# Patient Record
Sex: Male | Born: 1985 | Hispanic: Yes | Marital: Single | Smoking: Never smoker
Health system: Southern US, Community
[De-identification: ages and names within clinical notes are randomized; demographics above are authoritative.]

## PROBLEM LIST (undated history)

## (undated) DIAGNOSIS — Z9109 Other allergy status, other than to drugs and biological substances: Secondary | ICD-10-CM

---

## 2009-12-25 HISTORY — PX: FRACTURE SURGERY: SHX138

## 2012-05-28 ENCOUNTER — Other Ambulatory Visit (HOSPITAL_COMMUNITY): Payer: Self-pay | Admitting: Orthopedic Surgery

## 2012-06-10 ENCOUNTER — Encounter (HOSPITAL_COMMUNITY): Payer: Self-pay | Admitting: Pharmacy Technician

## 2012-06-11 ENCOUNTER — Encounter (HOSPITAL_COMMUNITY)
Admission: RE | Admit: 2012-06-11 | Discharge: 2012-06-11 | Disposition: A | Discharge status: Home / Self Care | Payer: Worker's Compensation | Source: Ambulatory Visit | Attending: Orthopedic Surgery | Admitting: Orthopedic Surgery

## 2012-06-11 ENCOUNTER — Encounter (HOSPITAL_COMMUNITY): Payer: Self-pay

## 2012-06-11 HISTORY — DX: Other allergy status, other than to drugs and biological substances: Z91.09

## 2012-06-11 LAB — TYPE AND SCREEN: ABO/RH(D): A POS

## 2012-06-11 LAB — CBC
Hemoglobin: 16 g/dL (ref 13.0–17.0)
MCV: 82.8 fL (ref 78.0–100.0)
Platelets: 213 10*3/uL (ref 150–400)
RBC: 5.24 MIL/uL (ref 4.22–5.81)
WBC: 6.3 10*3/uL (ref 4.0–10.5)

## 2012-06-11 LAB — SURGICAL PCR SCREEN: Staphylococcus aureus: POSITIVE — AB

## 2012-06-11 LAB — BASIC METABOLIC PANEL
CO2: 27 mEq/L (ref 19–32)
Calcium: 9.3 mg/dL (ref 8.4–10.5)
Potassium: 3.7 mEq/L (ref 3.5–5.1)
Sodium: 139 mEq/L (ref 135–145)

## 2012-06-11 LAB — ABO/RH: ABO/RH(D): A POS

## 2012-06-11 MED ORDER — CHLORHEXIDINE GLUCONATE 4 % EX LIQD: 60.0000 mL | Freq: Once | CUTANEOUS | Status: DC

## 2012-06-11 NOTE — Progress Notes (Signed)
Interpreter  Arthor Captain with patient

## 2012-06-11 NOTE — Pre-Procedure Instructions (Signed)
20 Shahzaib Azevedo  06/11/2012   Your procedure is scheduled on:  *06/18/12*  Report to [facility] at 530 AM.  Call this number if you have problems the morning of surgery: [phone]   Remember:   Do not eat food:After Midnight.    Take these medicines the morning of surgery with A SIP OF WATER: tylenol, afrin   Do not wear jewelry, make-up or nail polish.  Do not wear lotions, powders, or perfumes. You may wear deodorant.  Do not shave 48 hours prior to surgery. Men may shave face and neck.  Do not bring valuables to the hospital.  Contacts, dentures or bridgework may not be worn into surgery.  Leave suitcase in the car. After surgery it may be brought to your room.  For patients admitted to the hospital, checkout time is 11:00 AM the day of discharge.   Patients discharged the day of surgery will not be allowed to drive home.  Name and phone number of your driver: Byrd Hesselbach wife [phone]  Special Instructions: CHG Shower Use Special Wash: 1/2 bottle night before surgery and 1/2 bottle morning of surgery.   Please read over the following fact sheets that you were given: Pain Booklet, Coughing and Deep Breathing, Blood Transfusion Information and MRSA Information

## 2012-06-17 MED ORDER — CEFAZOLIN SODIUM-DEXTROSE 2-3 GM-% IV SOLR
2.0000 g | INTRAVENOUS | Status: AC
  Administered 2012-06-18: 2 g via INTRAVENOUS
  Filled 2012-06-17: qty 50

## 2012-06-18 ENCOUNTER — Encounter (HOSPITAL_COMMUNITY)
Admission: RE | Disposition: A | Discharge status: Home / Self Care | Payer: Self-pay | Source: Ambulatory Visit | Attending: Orthopedic Surgery

## 2012-06-18 ENCOUNTER — Encounter (HOSPITAL_COMMUNITY): Payer: Self-pay | Admitting: *Deleted

## 2012-06-18 ENCOUNTER — Inpatient Hospital Stay (HOSPITAL_COMMUNITY): Payer: Worker's Compensation

## 2012-06-18 ENCOUNTER — Encounter (HOSPITAL_COMMUNITY): Payer: Self-pay | Admitting: Anesthesiology

## 2012-06-18 ENCOUNTER — Inpatient Hospital Stay (HOSPITAL_COMMUNITY)
Admission: RE | Admit: 2012-06-18 | Discharge: 2012-06-19 | DRG: 482 | Disposition: A | Discharge status: Home / Self Care | Payer: Worker's Compensation | Source: Ambulatory Visit | Attending: Orthopedic Surgery | Admitting: Orthopedic Surgery

## 2012-06-18 ENCOUNTER — Ambulatory Visit (HOSPITAL_COMMUNITY): Payer: Worker's Compensation | Admitting: Anesthesiology

## 2012-06-18 DIAGNOSIS — IMO0002 Reserved for concepts with insufficient information to code with codable children: Principal | ICD-10-CM | POA: Diagnosis present

## 2012-06-18 DIAGNOSIS — S7290XA Unspecified fracture of unspecified femur, initial encounter for closed fracture: Secondary | ICD-10-CM

## 2012-06-18 DIAGNOSIS — Z79899 Other long term (current) drug therapy: Secondary | ICD-10-CM

## 2012-06-18 HISTORY — PX: FEMUR IM NAIL: SHX1597

## 2012-06-18 SURGERY — INSERTION, INTRAMEDULLARY ROD, FEMUR
Anesthesia: General | Site: Leg Upper | Laterality: Left | Wound class: Clean

## 2012-06-18 MED ORDER — DIPHENHYDRAMINE HCL 50 MG/ML IJ SOLN: 12.5000 mg | Freq: Four times a day (QID) | INTRAMUSCULAR | Status: DC | PRN

## 2012-06-18 MED ORDER — DEXAMETHASONE SODIUM PHOSPHATE 4 MG/ML IJ SOLN
INTRAMUSCULAR | Status: DC | PRN
  Administered 2012-06-18: 8 mg via INTRAVENOUS

## 2012-06-18 MED ORDER — FENTANYL CITRATE 0.05 MG/ML IJ SOLN
INTRAMUSCULAR | Status: DC | PRN
  Administered 2012-06-18: 50 ug via INTRAVENOUS
  Administered 2012-06-18: 100 ug via INTRAVENOUS
  Administered 2012-06-18: 250 ug via INTRAVENOUS
  Administered 2012-06-18: 100 ug via INTRAVENOUS

## 2012-06-18 MED ORDER — ROCURONIUM BROMIDE 100 MG/10ML IV SOLN
INTRAVENOUS | Status: DC | PRN
  Administered 2012-06-18: 50 mg via INTRAVENOUS

## 2012-06-18 MED ORDER — LIDOCAINE HCL (CARDIAC) 20 MG/ML IV SOLN
INTRAVENOUS | Status: DC | PRN
  Administered 2012-06-18: 40 mg via INTRAVENOUS

## 2012-06-18 MED ORDER — MORPHINE SULFATE 2 MG/ML IJ SOLN: 1.0000 mg | INTRAMUSCULAR | Status: DC | PRN

## 2012-06-18 MED ORDER — MEPERIDINE HCL 25 MG/ML IJ SOLN: 6.2500 mg | INTRAMUSCULAR | Status: DC | PRN

## 2012-06-18 MED ORDER — METHOCARBAMOL 500 MG PO TABS
500.0000 mg | ORAL_TABLET | Freq: Four times a day (QID) | ORAL | Status: DC | PRN
  Administered 2012-06-18: 500 mg via ORAL
  Filled 2012-06-18: qty 1

## 2012-06-18 MED ORDER — METHOCARBAMOL 100 MG/ML IJ SOLN
500.0000 mg | Freq: Four times a day (QID) | INTRAVENOUS | Status: DC | PRN
  Filled 2012-06-18: qty 5

## 2012-06-18 MED ORDER — PROPOFOL 10 MG/ML IV EMUL
INTRAVENOUS | Status: DC | PRN
  Administered 2012-06-18: 400 mg via INTRAVENOUS

## 2012-06-18 MED ORDER — MIDAZOLAM HCL 2 MG/2ML IJ SOLN: 0.5000 mg | Freq: Once | INTRAMUSCULAR | Status: DC | PRN

## 2012-06-18 MED ORDER — NALOXONE HCL 0.4 MG/ML IJ SOLN: 0.4000 mg | INTRAMUSCULAR | Status: DC | PRN

## 2012-06-18 MED ORDER — MIDAZOLAM HCL 5 MG/5ML IJ SOLN
INTRAMUSCULAR | Status: DC | PRN
  Administered 2012-06-18: 2 mg via INTRAVENOUS

## 2012-06-18 MED ORDER — HYDROMORPHONE HCL PF 1 MG/ML IJ SOLN
INTRAMUSCULAR | Status: AC
  Filled 2012-06-18: qty 1

## 2012-06-18 MED ORDER — MORPHINE SULFATE (PF) 1 MG/ML IV SOLN
INTRAVENOUS | Status: AC
  Filled 2012-06-18: qty 25

## 2012-06-18 MED ORDER — LACTATED RINGERS IV SOLN
INTRAVENOUS | Status: DC | PRN
  Administered 2012-06-18 (×4): via INTRAVENOUS

## 2012-06-18 MED ORDER — SODIUM CHLORIDE 0.9 % IJ SOLN: 9.0000 mL | INTRAMUSCULAR | Status: DC | PRN

## 2012-06-18 MED ORDER — ONDANSETRON HCL 4 MG/2ML IJ SOLN: 4.0000 mg | Freq: Four times a day (QID) | INTRAMUSCULAR | Status: DC | PRN

## 2012-06-18 MED ORDER — PROMETHAZINE HCL 25 MG/ML IJ SOLN: 6.2500 mg | INTRAMUSCULAR | Status: DC | PRN

## 2012-06-18 MED ORDER — OXYCODONE-ACETAMINOPHEN 5-325 MG PO TABS
1.0000 | ORAL_TABLET | ORAL | Status: DC | PRN
  Administered 2012-06-19 (×2): 2 via ORAL
  Filled 2012-06-18 (×2): qty 2

## 2012-06-18 MED ORDER — METOCLOPRAMIDE HCL 5 MG/ML IJ SOLN: 5.0000 mg | Freq: Three times a day (TID) | INTRAMUSCULAR | Status: DC | PRN

## 2012-06-18 MED ORDER — HYDROMORPHONE HCL PF 1 MG/ML IJ SOLN
0.2500 mg | INTRAMUSCULAR | Status: DC | PRN
  Administered 2012-06-18 (×2): 0.5 mg via INTRAVENOUS

## 2012-06-18 MED ORDER — DIPHENHYDRAMINE HCL 12.5 MG/5ML PO ELIX: 12.5000 mg | ORAL_SOLUTION | Freq: Four times a day (QID) | ORAL | Status: DC | PRN

## 2012-06-18 MED ORDER — ONDANSETRON HCL 4 MG PO TABS: 4.0000 mg | ORAL_TABLET | Freq: Four times a day (QID) | ORAL | Status: DC | PRN

## 2012-06-18 MED ORDER — 0.9 % SODIUM CHLORIDE (POUR BTL) OPTIME
TOPICAL | Status: DC | PRN
  Administered 2012-06-18: 1000 mL

## 2012-06-18 MED ORDER — METOCLOPRAMIDE HCL 10 MG PO TABS: 5.0000 mg | ORAL_TABLET | Freq: Three times a day (TID) | ORAL | Status: DC | PRN

## 2012-06-18 MED ORDER — POTASSIUM CHLORIDE IN NACL 20-0.9 MEQ/L-% IV SOLN
INTRAVENOUS | Status: AC
  Administered 2012-06-18: 1000 mL via INTRAVENOUS
  Filled 2012-06-18 (×2): qty 1000

## 2012-06-18 MED ORDER — CEFAZOLIN SODIUM 1-5 GM-% IV SOLN
1.0000 g | Freq: Three times a day (TID) | INTRAVENOUS | Status: AC
  Administered 2012-06-18 – 2012-06-19 (×3): 1 g via INTRAVENOUS
  Filled 2012-06-18 (×3): qty 50

## 2012-06-18 MED ORDER — MORPHINE SULFATE (PF) 1 MG/ML IV SOLN
INTRAVENOUS | Status: DC
  Administered 2012-06-18: 25 mL via INTRAVENOUS
  Administered 2012-06-18: 0.5 mg via INTRAVENOUS
  Administered 2012-06-19: 0.1 mg via INTRAVENOUS
  Administered 2012-06-19: 1.5 mg via INTRAVENOUS

## 2012-06-18 MED ORDER — ONDANSETRON HCL 4 MG/2ML IJ SOLN
INTRAMUSCULAR | Status: DC | PRN
  Administered 2012-06-18: 4 mg via INTRAVENOUS

## 2012-06-18 SURGICAL SUPPLY — 63 items
15mm Internal Hex Nail Cap (Cap) ×2 IMPLANT
BIT DRILL LONG 4.0MM (BIT) ×2 IMPLANT
BIT DRILL SHORT 4.0 (BIT) ×2 IMPLANT
BLADE SURG 15 STRL LF DISP TIS (BLADE) ×1 IMPLANT
BLADE SURG 15 STRL SS (BLADE) ×1
BLADE SURG ROTATE 9660 (MISCELLANEOUS) ×2 IMPLANT
BNDG COHESIVE 6X5 TAN STRL LF (GAUZE/BANDAGES/DRESSINGS) ×4 IMPLANT
BOLT EXTRACTION (Bolt) ×2 IMPLANT
BONE CHIP PRESERV 20CC (Bone Implant) ×2 IMPLANT
CLOTH BEACON ORANGE TIMEOUT ST (SAFETY) ×2 IMPLANT
COVER SURGICAL LIGHT HANDLE (MISCELLANEOUS) ×2 IMPLANT
DRAPE INCISE IOBAN 66X45 STRL (DRAPES) ×6 IMPLANT
DRAPE ORTHO SPLIT 77X108 STRL (DRAPES) ×1
DRAPE PROXIMA HALF (DRAPES) ×2 IMPLANT
DRAPE STERI IOBAN 125X83 (DRAPES) IMPLANT
DRAPE SURG ORHT 6 SPLT 77X108 (DRAPES) ×1 IMPLANT
DRILL BIT LONG 4.0MM (BIT) ×4
DRILL BIT SHORT 4.0 (BIT) ×2
DRSG MEPILEX BORDER 4X12 (GAUZE/BANDAGES/DRESSINGS) ×2 IMPLANT
DRSG MEPILEX BORDER 4X4 (GAUZE/BANDAGES/DRESSINGS) IMPLANT
DRSG MEPILEX BORDER 4X8 (GAUZE/BANDAGES/DRESSINGS) IMPLANT
DURAPREP 26ML APPLICATOR (WOUND CARE) ×2 IMPLANT
ELECT REM PT RETURN 9FT ADLT (ELECTROSURGICAL) ×2
ELECTRODE REM PT RTRN 9FT ADLT (ELECTROSURGICAL) ×1 IMPLANT
FACESHIELD LNG OPTICON STERILE (SAFETY) IMPLANT
GAUZE XEROFORM 5X9 LF (GAUZE/BANDAGES/DRESSINGS) IMPLANT
GLOVE BIO SURGEON STRL SZ8.5 (GLOVE) ×2 IMPLANT
GLOVE BIOGEL PI IND STRL 8 (GLOVE) ×1 IMPLANT
GLOVE BIOGEL PI INDICATOR 8 (GLOVE) ×1
GLOVE ECLIPSE 7.5 STRL STRAW (GLOVE) ×2 IMPLANT
GLOVE SURG ORTHO 8.0 STRL STRW (GLOVE) ×2 IMPLANT
GLOVE SURG SS PI 8.5 STRL IVOR (GLOVE) ×1
GLOVE SURG SS PI 8.5 STRL STRW (GLOVE) ×1 IMPLANT
GOWN PREVENTION PLUS LG XLONG (DISPOSABLE) IMPLANT
GOWN PREVENTION PLUS XLARGE (GOWN DISPOSABLE) ×2 IMPLANT
GOWN STRL NON-REIN LRG LVL3 (GOWN DISPOSABLE) ×2 IMPLANT
GUIDE ROD 3.0 (MISCELLANEOUS) ×2
KIT BASIN OR (CUSTOM PROCEDURE TRAY) ×2 IMPLANT
KIT ROOM TURNOVER OR (KITS) ×2 IMPLANT
MANIFOLD NEPTUNE II (INSTRUMENTS) IMPLANT
NS IRRIG 1000ML POUR BTL (IV SOLUTION) ×2 IMPLANT
PACK GENERAL/GYN (CUSTOM PROCEDURE TRAY) ×2 IMPLANT
PAD ARMBOARD 7.5X6 YLW CONV (MISCELLANEOUS) ×4 IMPLANT
REAMER ROD DEEP FLUTE 2.5X950 (INSTRUMENTS) ×2 IMPLANT
ROD GUIDE 3.0 (MISCELLANEOUS) ×1 IMPLANT
SCREW TRIGEN LOW PROF 5.0X52.5 (Screw) ×2 IMPLANT
SCREW TRIGEN LOW PROF 5.0X65 (Screw) ×4 IMPLANT
SPONGE LAP 18X18 X RAY DECT (DISPOSABLE) ×2 IMPLANT
SPONGE LAP 4X18 X RAY DECT (DISPOSABLE) IMPLANT
STAPLER VISISTAT 35W (STAPLE) IMPLANT
SUT ETHILON 2 0 FS 18 (SUTURE) IMPLANT
SUT VIC AB 0 CT1 27 (SUTURE) ×1
SUT VIC AB 0 CT1 27XBRD ANBCTR (SUTURE) ×1 IMPLANT
SUT VIC AB 1 CT1 27 (SUTURE) ×2
SUT VIC AB 1 CT1 27XBRD ANBCTR (SUTURE) ×2 IMPLANT
SUT VIC AB 2-0 CT1 27 (SUTURE) ×2
SUT VIC AB 2-0 CT1 TAPERPNT 27 (SUTURE) ×2 IMPLANT
SUT VIC AB 2-0 CTB1 (SUTURE) ×4 IMPLANT
TAPE STRIPS DRAPE STRL (GAUZE/BANDAGES/DRESSINGS) IMPLANT
TOWEL OR 17X24 6PK STRL BLUE (TOWEL DISPOSABLE) ×2 IMPLANT
TOWEL OR 17X26 10 PK STRL BLUE (TOWEL DISPOSABLE) ×2 IMPLANT
Trochanteric Antegrade Nail (Orthopedic Implant) ×2 IMPLANT
WATER STERILE IRR 1000ML POUR (IV SOLUTION) IMPLANT

## 2012-06-18 NOTE — Anesthesia Procedure Notes (Signed)
Procedure Name: Intubation Date/Time: 06/18/2012 7:53 AM Performed by: Burna Cash Pre-anesthesia Checklist: Patient identified, Emergency Drugs available, Suction available and Patient being monitored Patient Re-evaluated:Patient Re-evaluated prior to inductionOxygen Delivery Method: Circle System Utilized Preoxygenation: Pre-oxygenation with 100% oxygen Intubation Type: IV induction Ventilation: Mask ventilation without difficulty Laryngoscope Size: Mac and 3 Grade View: Grade I Tube type: Oral Tube size: 8.0 mm Number of attempts: 1 Placement Confirmation: ETT inserted through vocal cords under direct vision,  positive ETCO2 and breath sounds checked- equal and bilateral Secured at: 22 cm Tube secured with: Tape Dental Injury: Teeth and Oropharynx as per pre-operative assessment

## 2012-06-18 NOTE — Op Note (Signed)
Shawn Randall, GIN NO.:  [id number]  MEDICAL RECORD NO.:  [id number]  LOCATION:  [facility]                         FACILITY:  [facility]  PHYSICIAN:  Burnard Bunting, M.D.    DATE OF BIRTH:  21-Mar-1986  DATE OF PROCEDURE: DATE OF DISCHARGE:                              OPERATIVE REPORT   PREOPERATIVE DIAGNOSIS:  Left femoral shaft nonunion.  POSTOPERATIVE DIAGNOSIS:  Left femoral shaft nonunion.  PROCEDURE:  Left femoral shaft nonunion takedown with removal of hardware x4 through 4 separate incisions and exchange intramedullary nailing with replacement of the Synthes 12 x 38 nail with Smith and Nephew, size 13 mm nail 536 with 1 proximal and 2 distal interlocks.  SURGEON:  Burnard Bunting, M.D.  ASSISTANT:  Wende Neighbors, P.A.  ANESTHESIA:  General trach.  ESTIMATED BLOOD LOSS:  150.  DRAINS:  None.  INDICATIONS:  Shawn Randall is a 26 year old patient with left femoral shaft fracture nonunion presents now for operative management after explanation of risks and benefits.  PROCEDURE IN DETAIL:  The patient was brought to the operating room, where general endotracheal anesthesia was induced and preoperative antibiotics were administered.  The patient was placed on lateral position on the [place] table with the peroneal nerve and right axilla well padded.  The left leg was prescrubbed with alcohol and Betadine which allowed to air dry, prepped with DuraPrep solution and draped in a sterile manner.  Collier Flowers was used to cover the operative field. Time-out was called.  The fracture site was initially opened through the prior open fracture site on the lateral aspect of the leg.  Skin and subcutaneous tissue were sharply divided.  The periosteum was elevated with the flap anteriorly and posteriorly from around the nonunion site. Nonunion site was localized using fluoroscopic guidance and was taken down.  The fibrous tissue was removed with a curette, rongeur, and osteotome.   Following a full removal of the of the nonunion site under fluoroscopic guidance, the proximal interlock incision was used and extended.  Under direct visualization, the proximal interlocking screw was removed from its deep position.  At this time, the incision from the nail placement initially was used and extended proximally.  Skin and subcutaneous tissue sharply divided.  Fascia was divided.  Muscle was split using a guide wire.  The entry site into the nail was identified. The extraction device was then placed and screwed into the proximal portion of the femur and the nail was removed.  At this time, guide pin was then placed back into the femoral shaft.  Reaming was performed up to 13 mm.  With the guide pin in place, the femoral nonunion site was more thoroughly debrided and taken down.  Bone graft from the hypertrophic nonunion was combined with allograft morsels, and this area was then irrigated, bone grafted and closed.  The reaming was then performed up to 14.5 mm and then the nail was placed.  The 2 distal interlocking screws were placed.  The nail was backslapped, fracture site closed, a proximal interlocking screw was then placed, nail cap was placed.  At this time, all incisions were thoroughly irrigated and closed using interrupted inverted 0 Vicryl suture,  2-0 Vicryl suture, and skin staples.  The patient was then placed with Mepilex dressings. He will be partial weightbearing.  The patient tolerated the procedure without immediate complications.  Velna Hatchet Vernon's assistance was required all times during the case for opening, closing, retraction, neurovascular structures for assistance was a medical necessity.     Burnard Bunting, M.D.     GSD/MEDQ  D:  06/18/2012  T:  06/18/2012  Job:  [id number]

## 2012-06-18 NOTE — Brief Op Note (Signed)
06/18/2012  11:18 AM  PATIENT:  Danne Baxter  26 y.o. male  PRE-OPERATIVE DIAGNOSIS:  Left femoral shaft non-union  POST-OPERATIVE DIAGNOSIS:  Left femoral shaft non-union  PROCEDURE:  Procedure(s): Exchange INTRAMEDULLARY (IM) NAIL FEMORAL, removal of hardware x 2 and bone grafting non union site  SURGEON:  Surgeon(s): Cammy Copa, MD  ASSISTANT: s vernon  ANESTHESIA:   general  EBL: 150 ml    Total I/O In: 2800 [I.V.:2800] Out: 400 [Blood:400]  BLOOD ADMINISTERED: none  DRAINS: none   LOCAL MEDICATIONS USED:  none  SPECIMEN:  No Specimen  COUNTS:  YES  TOURNIQUET:  * No tourniquets in log *  DICTATION: .Other Dictation: Dictation [id number]*  PLAN OF CARE: Admit to inpatient   PATIENT DISPOSITION:  PACU - hemodynamically stable

## 2012-06-18 NOTE — H&P (Signed)
Shawn Randall is an 26 y.o. male.   Chief Complaint: Left leg pain  HPI: Shawn Randall is a 26 year old patient who is several months out from open left femur fracture. This was treated elsewhere with an intramedullary nail. The patient did reasonably well until he developed pain in the lower extremity with ambulation. Radiographs show delayed union. Distal interlocking screws were removed seve laboratory studies were negative for infection. He presents now for a change nailing and bone grafting of the distal femoral site. He denies any fever and chills but does report pain with ambulation.ral weeks ago but he is progressed on to nonunion.  Past Medical History  Diagnosis Date  . Environmental allergies     Past Surgical History  Procedure Date  . Fracture surgery 11    lft femur    History reviewed. No pertinent family history. Social History:  reports that he has never smoked. He does not have any smokeless tobacco history on file. He reports that he does not drink alcohol or use illicit drugs.  Allergies: No Known Allergies  Medications Prior to Admission  Medication Sig Dispense Refill  . oxymetazoline (AFRIN) 0.05 % nasal spray Place 2 sprays into the nose as needed.      Marland Kitchen acetaminophen (TYLENOL) 500 MG tablet Take 500 mg by mouth every 6 (six) hours as needed.        No results found for this or any previous visit (from the past 48 hour(s)). No results found.  Review of Systems  Constitutional: Negative.   HENT: Negative.   Eyes: Negative.   Respiratory: Negative.   Gastrointestinal: Negative.   Genitourinary: Negative.   Musculoskeletal: Positive for joint pain.  Skin: Negative.   Neurological: Negative.   Endo/Heme/Allergies: Negative.   Psychiatric/Behavioral: Negative.     Blood pressure 134/86, pulse 64, temperature 98.1 F (36.7 C), temperature source Oral, resp. rate 18, SpO2 99.00%. Physical Exam  Constitutional: He is oriented to person, place, and time. He  appears well-developed.  HENT:  Head: Normocephalic.  Eyes: Pupils are equal, round, and reactive to light.  Neck: Normal range of motion.  Cardiovascular: Normal rate.   Respiratory: Effort normal.  GI: Soft.  Neurological: He is alert and oriented to person, place, and time.  Skin: Skin is warm.   examination of the left lower extremity demonstrates full range of motion of the knee well-healed surgical incision this around the distal lateral femur and proximal hip region. No groin pain with internal extra rotation of the leg. There is no effusion in the knee.   Assessment/Plan Impression is distal femoral nonunion following distal runoff removal. Plan is for exchange nailing with opening of the fracture site scraping of the bone and bone grafting. Risk and benefits are discussed with the patient through an interpreter including but not limited to infection nerve vessel damage continue nonunion. Patient understands the risk and benefits and agrees to proceed all questions answered.  Gianmarco Roye SCOTT 06/18/2012, 7:25 AM

## 2012-06-18 NOTE — Progress Notes (Signed)
Dr. August Saucer informed of Left Femur XRAY results; no further orders at this time.

## 2012-06-18 NOTE — Progress Notes (Signed)
UR COMPLETED  

## 2012-06-18 NOTE — Anesthesia Preprocedure Evaluation (Addendum)
Anesthesia Evaluation  Patient identified by MRN, date of birth, ID band Patient awake    Reviewed: Allergy & Precautions, H&P , NPO status   History of Anesthesia Complications Negative for: history of anesthetic complications  Airway Mallampati: II TM Distance: >3 FB Neck ROM: Full    Dental No notable dental hx. (+) Teeth Intact and Dental Advisory Given   Pulmonary neg pulmonary ROS,  breath sounds clear to auscultation  Pulmonary exam normal       Cardiovascular negative cardio ROS  Rhythm:Regular Rate:Normal     Neuro/Psych negative neurological ROS  negative psych ROS   GI/Hepatic negative GI ROS, Neg liver ROS,   Endo/Other  negative endocrine ROS  Renal/GU negative Renal ROS     Musculoskeletal negative musculoskeletal ROS (+)   Abdominal   Peds  Hematology negative hematology ROS (+)   Anesthesia Other Findings   Reproductive/Obstetrics                          Anesthesia Physical Anesthesia Plan  ASA: I  Anesthesia Plan: General   Post-op Pain Management:    Induction: Intravenous  Airway Management Planned: Oral ETT  Additional Equipment:   Intra-op Plan:   Post-operative Plan: Extubation in OR  Informed Consent: I have reviewed the patients History and Physical, chart, labs and discussed the procedure including the risks, benefits and alternatives for the proposed anesthesia with the patient or authorized representative who has indicated his/her understanding and acceptance.   Dental advisory given  Plan Discussed with: CRNA and Surgeon  Anesthesia Plan Comments: (Plan routine monitors, GETA)        Anesthesia Quick Evaluation

## 2012-06-18 NOTE — Anesthesia Postprocedure Evaluation (Signed)
Anesthesia Post Note  Patient: Shawn Randall  Procedure(s) Performed: Procedure(s) (LRB): INTRAMEDULLARY (IM) NAIL FEMORAL (Left)  Anesthesia type: General  Patient location: PACU  Post pain: Pain level controlled and Adequate analgesia  Post assessment: Post-op Vital signs reviewed, Patient's Cardiovascular Status Stable, Respiratory Function Stable, Patent Airway and Pain level controlled  Last Vitals:  Filed Vitals:   06/18/12 1218  BP:   Pulse:   Temp:   Resp: 19    Post vital signs: Reviewed and stable  Level of consciousness: awake, alert  and oriented  Complications: No apparent anesthesia complications

## 2012-06-18 NOTE — Transfer of Care (Signed)
Immediate Anesthesia Transfer of Care Note  Patient: Shawn Randall  Procedure(s) Performed: Procedure(s) (LRB): INTRAMEDULLARY (IM) NAIL FEMORAL (Left)  Patient Location: PACU  Anesthesia Type: General  Level of Consciousness: awake, alert  and oriented  Airway & Oxygen Therapy: Patient Spontanous Breathing and Patient connected to face mask oxygen  Post-op Assessment: Report given to PACU RN and Post -op Vital signs reviewed and stable  Post vital signs: Reviewed and stable  Complications: No apparent anesthesia complications

## 2012-06-19 LAB — CBC
Platelets: 209 10*3/uL (ref 150–400)
RBC: 4.23 MIL/uL (ref 4.22–5.81)
RDW: 13 % (ref 11.5–15.5)
WBC: 13.2 10*3/uL — ABNORMAL HIGH (ref 4.0–10.5)

## 2012-06-19 MED ORDER — METHOCARBAMOL 500 MG PO TABS: 500.0000 mg | ORAL_TABLET | Freq: Four times a day (QID) | ORAL | Status: AC | PRN

## 2012-06-19 MED ORDER — OXYCODONE-ACETAMINOPHEN 5-325 MG PO TABS: 1.0000 | ORAL_TABLET | ORAL | Status: AC | PRN

## 2012-06-19 NOTE — Progress Notes (Signed)
CARE MANAGEMENT NOTE 06/19/2012  Patient:  Shawn Randall, Shawn Randall   Account Number:  [id number]  Date Initiated:  06/19/2012  Documentation initiated by:  Vance Peper  Subjective/Objective Assessment:   26 yr old male s/p left hip IM nail     Action/Plan:   Patient will need a shower chair. Pt under worker's comp. Marjean Donna [email or web address]@ [phone] authorization to order from [employer] [phone].Faxed order to [phone], order [phone].   Anticipated DC Date:  06/19/2012   Anticipated DC Plan:  HOME/SELF CARE         Choice offered to / List presented to:     DME arranged  SHOWER STOOL      DME agency  OTHER - SEE NOTE        Status of service:  Completed, signed off Medicare Important Message given?   (If response is "NO", the following Medicare IM given date fields will be blank) Date Medicare IM given:   Date Additional Medicare IM given:    Discharge Disposition:  Home - self care  Per UR Regulation:    If discussed at Long Length of Stay Meetings, dates discussed:    Comments:  06/19/12 1522 Vance Peper, RN BSN Case Manager Shower chair to be delivered to patients home.

## 2012-06-19 NOTE — Progress Notes (Signed)
Interpreter Wyvonnia Dusky for Discharge Instruction. Cala Bradford RN

## 2012-06-19 NOTE — Evaluation (Signed)
Physical Therapy Evaluation Patient Details Name: Shawn Randall MRN: [id number] DOB: 12/27/1985 Today's Date: 06/19/2012 Time: 4098-1191 PT Time Calculation (min): 34 min  PT Assessment / Plan / Recommendation Clinical Impression  pt presents s/p L femur nonunion and now with IM nail.  pt moving well and will have good help at home.  pt would benefit from shower seat without back.      PT Assessment  Patent does not need any further PT services    Follow Up Recommendations  Outpatient PT    Barriers to Discharge        Equipment Recommendations  Tub/shower seat    Recommendations for Other Services     Frequency      Precautions / Restrictions Precautions Precautions: Fall Restrictions Weight Bearing Restrictions: Yes LLE Weight Bearing: Partial weight bearing LLE Partial Weight Bearing Percentage or Pounds: 50%   Pertinent Vitals/Pain Did not rate but indicates very painful during mobility.  Pt premedicated.        Mobility  Bed Mobility Bed Mobility: Supine to Sit Supine to Sit: 4: Min assist;With rails Sitting - Scoot to Edge of Bed: 4: Min assist Details for Bed Mobility Assistance: A to support L LE.   Transfers Transfers: Sit to Stand;Stand to Sit Sit to Stand: 4: Min assist;From bed;From chair/3-in-1;With armrests Stand to Sit: 4: Min assist;To chair/3-in-1;With armrests Details for Transfer Assistance: cues for use of UEs, positioning of LEs.   Ambulation/Gait Ambulation/Gait Assistance: 4: Min guard Ambulation Distance (Feet): 120 Feet (150, 50) Assistive device: Rolling walker;Crutches Ambulation/Gait Assistance Details: Ambulated with RW with pt demo'ing good safety and technique.  Tried crutches and pt also demo'ing good safety.   Gait Pattern: Step-through pattern;Decreased stride length;Trunk flexed Stairs: Yes Stairs Assistance: 4: Min guard Stair Management Technique: Two rails;Forwards Number of Stairs: 2  Wheelchair Mobility Wheelchair  Mobility: No    Exercises     PT Diagnosis:    PT Problem List:   PT Treatment Interventions:     PT Goals    Visit Information  Last PT Received On: 06/19/12 Assistance Needed: +1 PT/OT Co-Evaluation/Treatment: Yes    Subjective Data  Subjective: I want to go home Patient Stated Goal: Home   Prior Functioning  Home Living Lives With: Family Available Help at Discharge: Family;Available 24 hours/day Type of Home: House Home Access: Stairs to enter [employer] of Steps: 2 Entrance Stairs-Rails: Right;Left;Can reach both Home Layout: One level Bathroom Shower/Tub: Health visitor: Standard Bathroom Accessibility: Yes Home Adaptive Equipment: Crutches;Walker - rolling Prior Function Level of Independence: Independent Able to Take Stairs?: Yes Driving: Yes Communication Communication: Prefers language other than English (Spanish) Dominant Hand: Right    Cognition  Overall Cognitive Status: Appears within functional limits for tasks assessed/performed Arousal/Alertness: Awake/alert Orientation Level: Appears intact for tasks assessed Behavior During Session: [facility] for tasks performed    Extremity/Trunk Assessment Right Lower Extremity Assessment RLE ROM/Strength/Tone: Within functional levels RLE Sensation: WFL - Light Touch Left Lower Extremity Assessment LLE ROM/Strength/Tone: Deficits;Due to pain LLE ROM/Strength/Tone Deficits: pt very painful limiting mobility.   LLE Sensation: WFL - Light Touch   Balance Balance Balance Assessed: No  End of Session PT - End of Session Equipment Utilized During Treatment: Gait belt Activity Tolerance: Patient tolerated treatment well Patient left: in chair;with call bell/phone within reach;with family/visitor present Nurse Communication: Mobility status  GP     Sunny Schlein, [place] [phone] 06/19/2012, 3:14 PM

## 2012-06-19 NOTE — Evaluation (Signed)
Occupational Therapy Evaluation Patient Details Name: Shawn Randall MRN: [id number] DOB: 11-24-1986 Today's Date: 06/19/2012 Time: 4098-1191 OT Time Calculation (min): 35 min  OT Assessment / Plan / Recommendation Clinical Impression  Patient is a 26 y/o male s/p left hip fx presenting to acute OT with all education completed. No further OT services required; will sign off. Patient and family educated on use of shower seat to increase functional performanec during shower transfer. Patient and family voiced understanding. Patient's nurse made aware of need for DME order.    OT Assessment  Patient does not need any further OT services    Follow Up Recommendations  No OT follow up       Equipment Recommendations  Tub/shower seat          Precautions / Restrictions Precautions Precautions: Fall Restrictions Weight Bearing Restrictions: Yes LLE Weight Bearing: Partial weight bearing LLE Partial Weight Bearing Percentage or Pounds: 50%   Pertinent Vitals/Pain Patient voiced increased pain in left leg. Pain increased during mobility. Patient was unable to give pain a number.    ADL  Grooming: Simulated;Independent Where Assessed - Grooming: Unsupported sitting Upper Body Bathing: Simulated;Supervision/safety Where Assessed - Upper Body Bathing: Unsupported sitting Lower Body Bathing: Simulated;Minimal assistance Where Assessed - Lower Body Bathing: Supported sit to stand Upper Body Dressing: Simulated;Supervision/safety Where Assessed - Upper Body Dressing: Unsupported sitting Lower Body Dressing: Performed;Minimal assistance Where Assessed - Lower Body Dressing: Supported sitting Toilet Transfer: Performed;Minimal assistance Statistician Method: Sit to Barista:  (to recliner) Toileting - Clothing Manipulation and Hygiene: Simulated;Minimal assistance Where Assessed - Engineer, mining and Hygiene: Lean right and/or left Equipment Used:  Gait belt Transfers/Ambulation Related to ADLs: Patient transfers at [employer] level with RW from bed to recliner.  ADL Comments: Educated patient on dressing technique of putting left leg in underwear and pant leg first before right leg. Patient voiced understanding.       Visit Information  Last OT Received On: 06/19/12 Assistance Needed: +1 PT/OT Co-Evaluation/Treatment: Yes    Subjective Data  Subjective: Patient complained of pain upon movement and mobility. Patient Stated Goal: Go home.   Prior Functioning  Home Living Lives With: Family Available Help at Discharge: Family;Available 24 hours/day Type of Home: House Home Access: Stairs to enter [employer] of Steps: 2 Entrance Stairs-Rails: Right;Left;Can reach both Home Layout: One level Bathroom Shower/Tub: Health visitor: Standard Bathroom Accessibility: Yes Home Adaptive Equipment: Crutches;Walker - rolling Prior Function Level of Independence: Independent Able to Take Stairs?: Yes Driving: Yes Vocation: Unemployed Communication Communication: Prefers language other than English (Spanish) Dominant Hand: Right    Cognition  Overall Cognitive Status: Appears within functional limits for tasks assessed/performed Arousal/Alertness: Awake/alert Orientation Level: Appears intact for tasks assessed Behavior During Session: [facility] for tasks performed       Mobility Bed Mobility Bed Mobility: Supine to Sit Supine to Sit: 4: Min assist;With rails Transfers Transfers: Sit to Stand;Stand to Sit Sit to Stand: 4: Min assist;From bed;From chair/3-in-1;With armrests Stand to Sit: 4: Min assist;To chair/3-in-1;With armrests         End of Session OT - End of Session Equipment Utilized During Treatment: Gait belt Activity Tolerance: Patient tolerated treatment well;Patient limited by pain Patient left: in chair;with call bell/phone within reach;with family/visitor present Nurse Communication:  Other (comment) (Need for DME order.)    Jeanene Erb, OTR/L [phone] 06/19/2012, 3:07 PM

## 2012-06-19 NOTE — Progress Notes (Signed)
Pt stable vss  Subjective: i feel good   Objective: Vital signs in last 24 hours: Temp:  [97.4 F (36.3 C)-98.9 F (37.2 C)] 98.6 F (37 C) (06/26 0981) Pulse Rate:  [81-98] 81  (06/26 0611) Resp:  [16-30] 18  (06/26 0611) BP: (134-151)/(72-92) 139/79 mmHg (06/26 0611) SpO2:  [97 %-100 %] 99 % (06/26 0611)  Intake/Output from previous day: 06/25 0701 - 06/26 0700 In: 3972.4 [P.O.:97.4; I.V.:3875] Out: 2400 [Urine:1900; Blood:500] Intake/Output this shift:    Exam:  Sensation intact distally Intact pulses distally Dorsiflexion/Plantar flexion intact  Labs:  Basename 06/19/12 0540  HGB 12.4*    Basename 06/19/12 0540  WBC 13.2*  RBC 4.23  HCT 34.5*  PLT 209   No results found for this basename: NA:2,K:2,CL:2,CO2:2,BUN:2,CREATININE:2,GLUCOSE:2,CALCIUM:2 in the last 72 hours No results found for this basename: LABPT:2,INR:2 in the last 72 hours  Assessment/Plan: Dc today after PT and dressing change Fu 1 week   Shawn Randall 06/19/2012, 7:18 AM

## 2012-06-20 LAB — WOUND CULTURE: Culture: NO GROWTH

## 2012-06-21 ENCOUNTER — Encounter (HOSPITAL_COMMUNITY): Payer: Self-pay | Admitting: Orthopedic Surgery

## 2012-06-23 LAB — ANAEROBIC CULTURE

## 2012-06-30 NOTE — Discharge Summary (Signed)
Physician Discharge Summary  Patient ID: Shawn Randall MRN: [id number] DOB/AGE: 1986/05/11 26 y.o.  Admit date: 06/18/2012 Discharge date: 06/20/2012 Admission Diagnoses:  Left femur fracture nonunion  Discharge Diagnoses:  Same  Surgeries: Procedure(s): INTRAMEDULLARY (IM) NAIL FEMORAL on 06/18/2012   Consultants:    Discharged Condition: Stable  Hospital Course: Greysin Medlen is an 26 y.o. 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  They were brought to the operating room on 06/18/2012 and underwent the above named procedures. Tolerated or well and sent home pwb on left leg   Antibiotics given:  Anti-infectives     Start     Dose/Rate Route Frequency Ordered Stop   06/18/12 1500   ceFAZolin (ANCEF) IVPB 1 g/50 mL premix        1 g 100 mL/hr over 30 Minutes Intravenous 3 times per day 06/18/12 1345 06/19/12 0531   06/17/12 1438   ceFAZolin (ANCEF) IVPB 2 g/50 mL premix        2 g 100 mL/hr over 30 Minutes Intravenous 60 min pre-op 06/17/12 1438 06/18/12 0758        .  Recent vital signs:  Filed Vitals:   06/19/12 1330  BP: 143/85  Pulse: 96  Temp: 98.4 F (36.9 C)  Resp: 18    Recent laboratory studies:  Results for orders placed during the hospital encounter of 06/18/12  WOUND CULTURE      Component Value Range   Specimen Description WOUND LEG LEFT     Special Requests IM NAIL NON UNION SITE PT ON ANCEF     Gram Stain       Value: FEW WBC PRESENT,BOTH PMN AND MONONUCLEAR     NO SQUAMOUS EPITHELIAL CELLS SEEN     NO ORGANISMS SEEN   Culture NO GROWTH 2 DAYS     Report Status 06/20/2012 FINAL    ANAEROBIC CULTURE      Component Value Range   Specimen Description WOUND LEG LEFT     Special Requests IM NAIL NON UNION SITE PT ON ANCEF     Gram Stain       Value: FEW WBC PRESENT,BOTH PMN AND MONONUCLEAR     NO SQUAMOUS EPITHELIAL CELLS SEEN     NO ORGANISMS SEEN     Culture NO ANAEROBES ISOLATED     Report Status 06/23/2012 FINAL    CBC      Component Value Range   WBC 13.2 (*) 4.0 - 10.5 K/uL   RBC 4.23  4.22 - 5.81 MIL/uL   Hemoglobin 12.4 (*) 13.0 - 17.0 g/dL   HCT 84.6 (*) 96.2 - 95.2 %   MCV 81.6  78.0 - 100.0 fL   MCH 29.3  26.0 - 34.0 pg   MCHC 35.9  30.0 - 36.0 g/dL   RDW 84.1  32.4 - 40.1 %   Platelets 209  150 - 400 K/uL    Discharge Medications:   Medication List  As of 06/30/2012 10:02 AM   STOP taking these medications         acetaminophen 500 MG tablet         TAKE these medications         oxymetazoline 0.05 % nasal spray   Commonly known as: AFRIN   Place 2 sprays into the nose as needed.            Diagnostic Studies: Dg  Chest 2 View  06/11/2012  *RADIOLOGY REPORT*  Clinical Data: Preop left femur surgery.  CHEST - 2 VIEW  Comparison: None.  Findings: Heart and mediastinal contours are within normal limits. No focal opacities or effusions.  No acute bony abnormality.  IMPRESSION: No active cardiopulmonary disease.  Original Report Authenticated By: Cyndie Chime, M.D.   Dg Femur Left  06/18/2012  *RADIOLOGY REPORT*  Clinical Data: IM nail.  LEFT FEMUR - 2 VIEW,DG C-ARM GT 120 MIN  Comparison: None.  Findings: Multiple intraoperative spot images demonstrate intermedullary nail across a midshaft left femoral fracture.  This fracture appears chronic, but no old imaging available.  There appears to be a fragment of a screw along the distal aspect of the IM nail.  Two distal screws and one proximal screw now present.  IMPRESSION: Intramedullary nail placement across the chronic left midshaft femoral fracture.  Original Report Authenticated By: Cyndie Chime, M.D.   Dg Femur Left Port  06/18/2012  *RADIOLOGY REPORT*  Clinical Data: Postop femur  PORTABLE LEFT FEMUR - 2 VIEW  Comparison: Intraoperative radiographs dated 06/18/2012  Findings: IM rod with one proximal and two distal interlocking screws transfixing a healing  distal femur fracture.  A 1.6 cm metallic fragment from a fractured screw is present medially within the distal intramedullary canal.  Lateral skin staples and associated subcutaneous gas.  IMPRESSION: IM rod fixation of a healing distal femur fracture.  Metallic screw fragment within the distal intramedullary canal, as described above.  Original Report Authenticated By: Charline Bills, M.D.   Dg C-arm Gt 120 Min  06/18/2012  *RADIOLOGY REPORT*  Clinical Data: IM nail.  LEFT FEMUR - 2 VIEW,DG C-ARM GT 120 MIN  Comparison: None.  Findings: Multiple intraoperative spot images demonstrate intermedullary nail across a midshaft left femoral fracture.  This fracture appears chronic, but no old imaging available.  There appears to be a fragment of a screw along the distal aspect of the IM nail.  Two distal screws and one proximal screw now present.  IMPRESSION: Intramedullary nail placement across the chronic left midshaft femoral fracture.  Original Report Authenticated By: Cyndie Chime, M.D.    Disposition: 01-Home or Self Care  Discharge Orders    Future Orders Please Complete By Expires   Diet - low sodium heart healthy      Call MD / Call 911      Comments:   If you experience chest pain or shortness of breath, CALL 911 and be transported to the hospital emergency room.  If you develope a fever above 101 F, pus (white drainage) or increased drainage or redness at the wound, or calf pain, call your surgeon's office.   Constipation Prevention      Comments:   Drink plenty of fluids.  Prune juice may be helpful.  You may use a stool softener, such as Colace (over the counter) 100 mg twice a day.  Use MiraLax (over the counter) for constipation as needed.   Increase activity slowly as tolerated      Discharge instructions      Comments:   1. 50 percent weight bearing on leg 2. Keep dressings dry 3. Return to clinic 7 days         Signed: Cammy Copa 06/30/2012, 10:02 AM

## 2013-03-19 IMAGING — CR DG FEMUR 2+V PORT*L*
2 series · 2 of 2 positions shown · non-contrast
Comparison: Intraoperative radiographs dated 06/18/2012

CLINICAL DATA: Postop femur

PORTABLE LEFT FEMUR - 2 VIEW

[AP (1 of 2)]
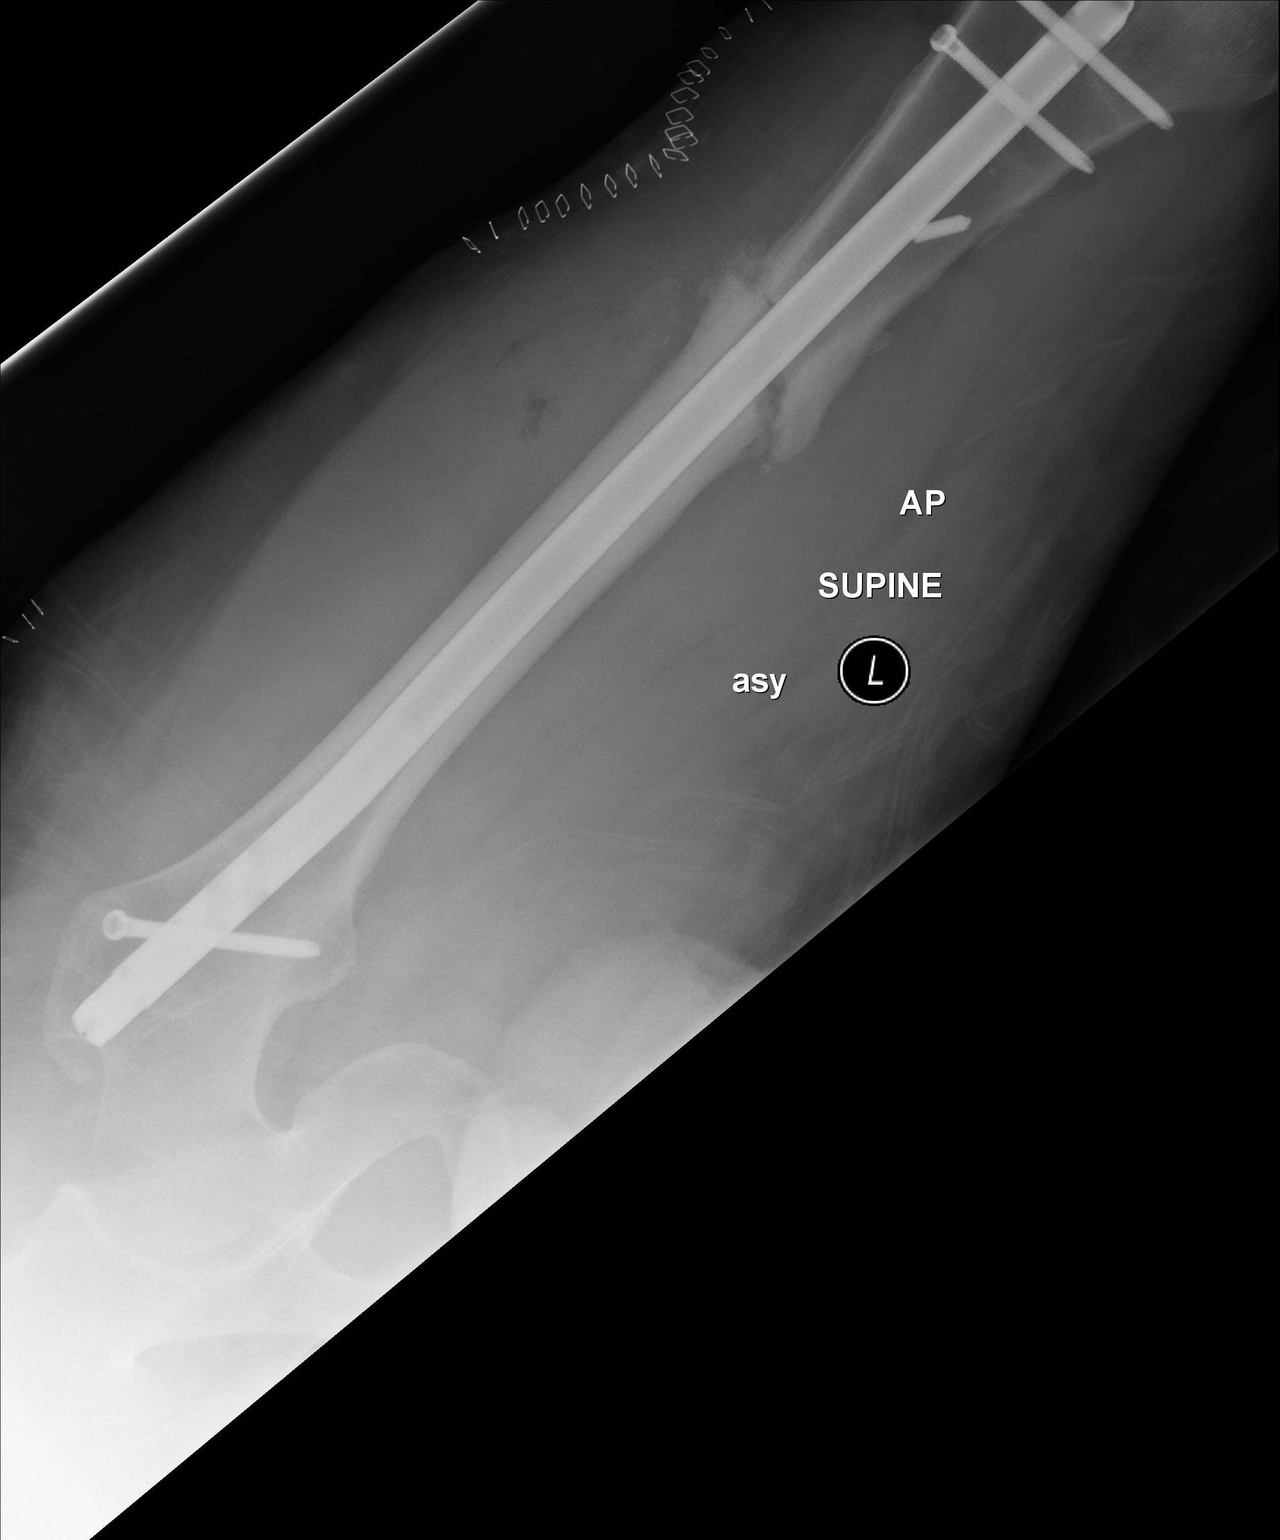

[AP (2 of 2)]
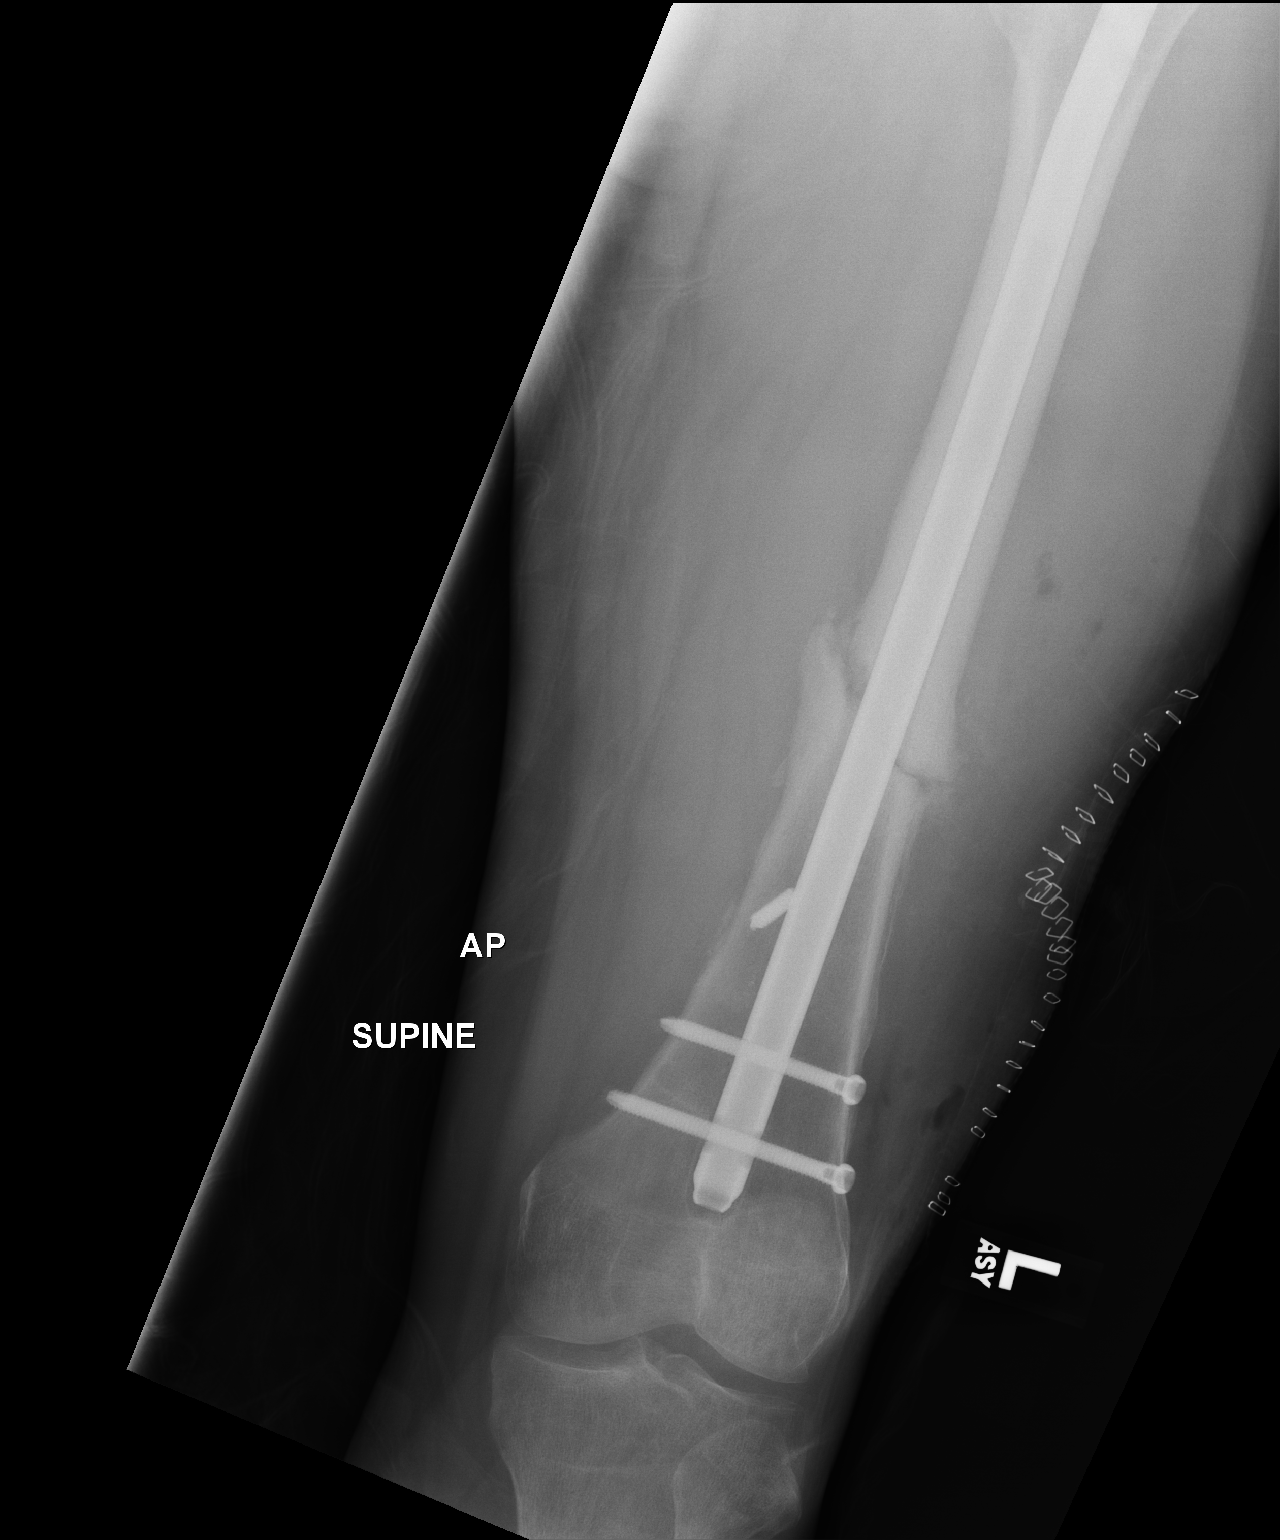

[2 of 2 positions shown; findings below may reference images not displayed]

FINDINGS: IM rod with one proximal and two distal interlocking
screws transfixing a healing distal femur fracture.

A 1.6 cm metallic fragment from a fractured screw is present
medially within the distal intramedullary canal.

Lateral skin staples and associated subcutaneous gas.
IMPRESSION: IM rod fixation of a healing distal femur fracture.

Metallic screw fragment within the distal intramedullary canal, as
described above.

## 2013-03-19 IMAGING — RF DG C-ARM GT 120 MIN
1 series · 6 of 6 positions shown · non-contrast
Comparison: None.

CLINICAL DATA: IM nail.

LEFT FEMUR - 2 VIEW,DG C-ARM GT 120 MIN

[Series 1: run · 6 of 6 slices shown]
[im 1/6]
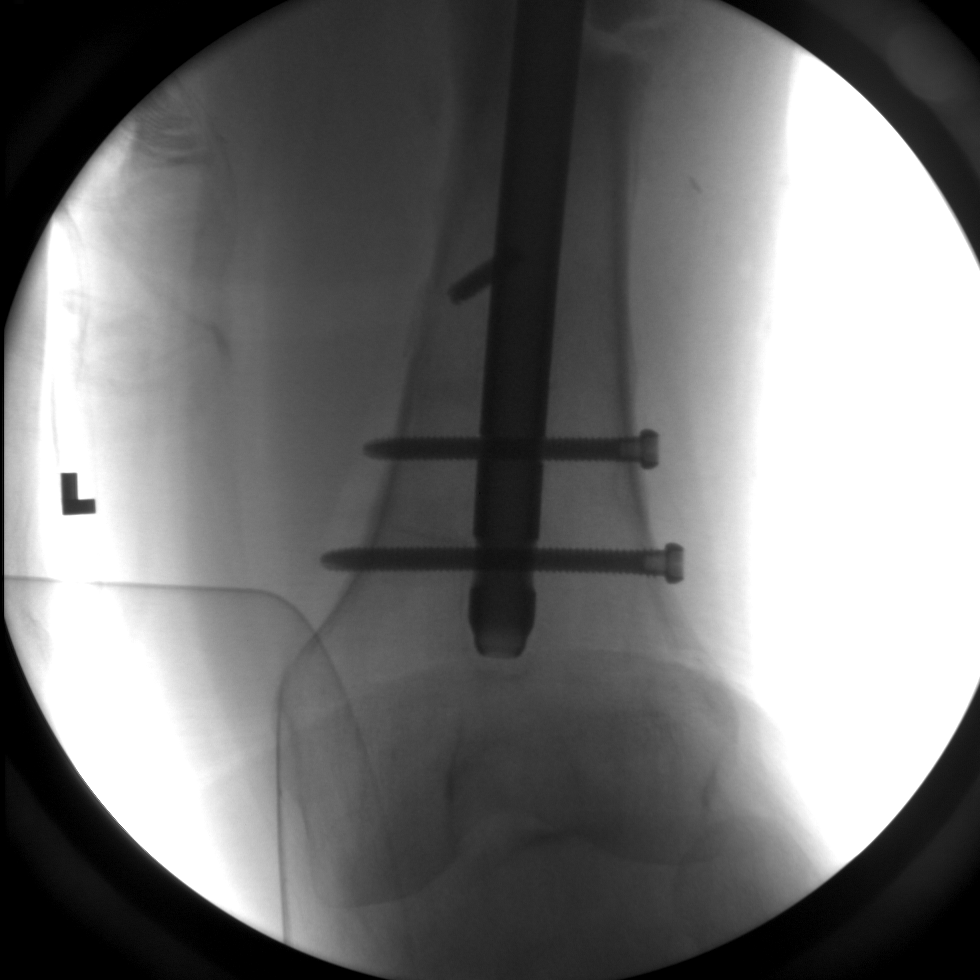
[im 2/6]
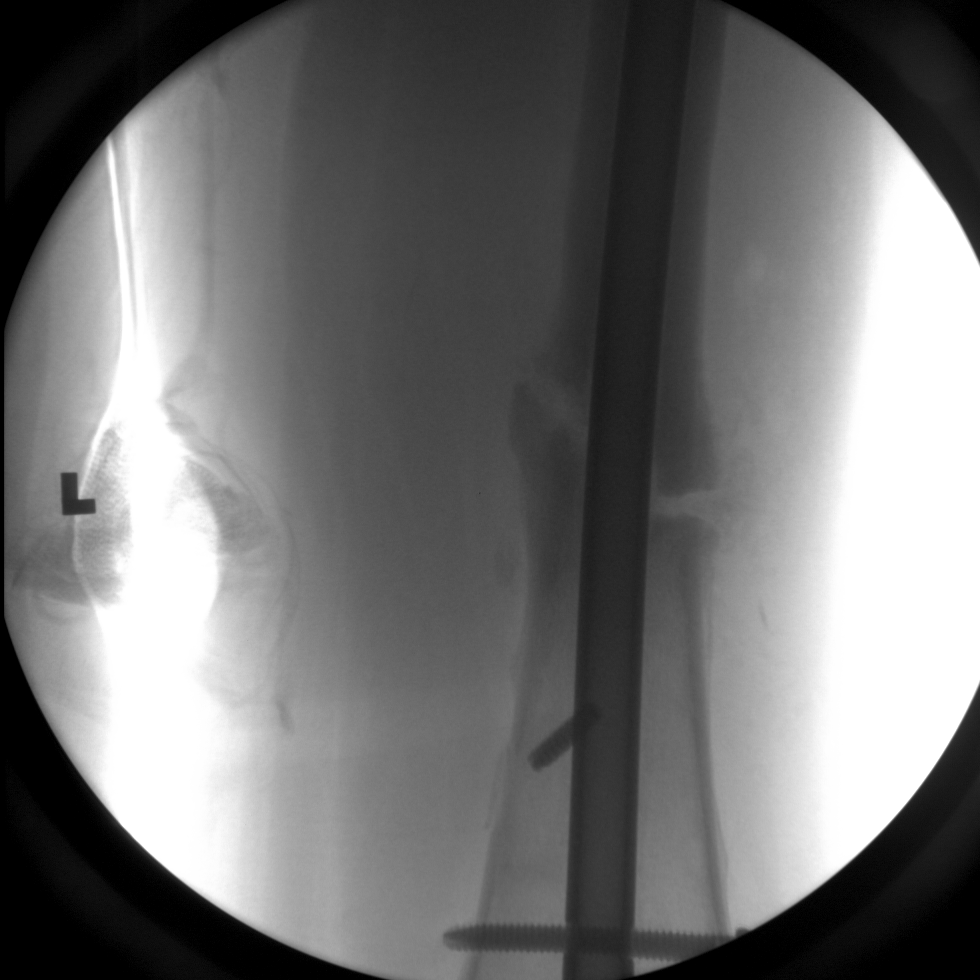
[im 3/6]
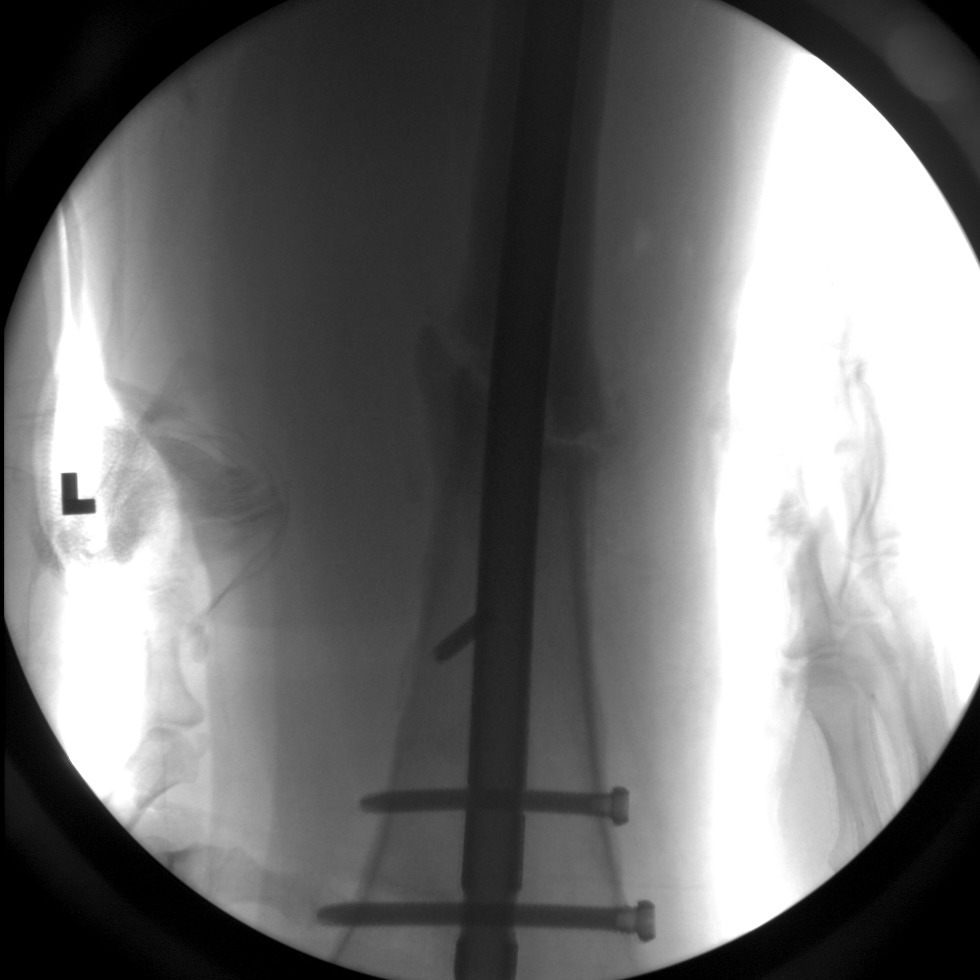
[im 4/6]
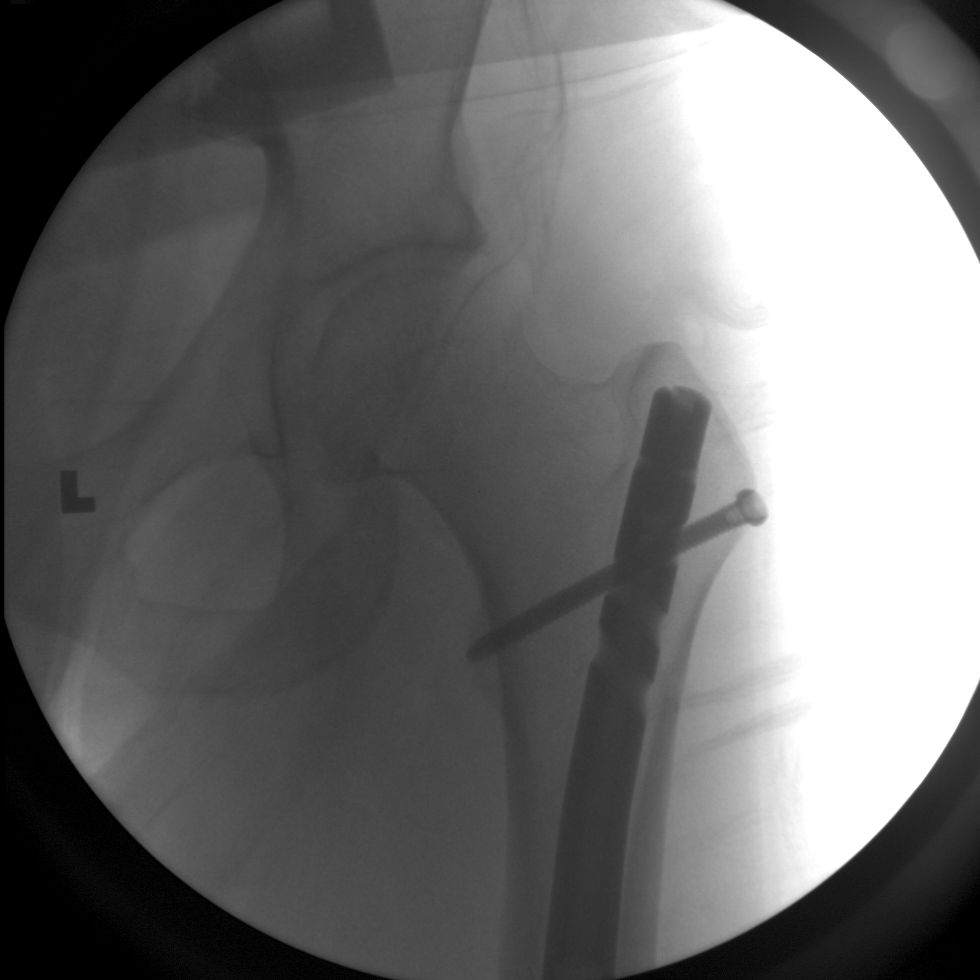
[im 5/6]
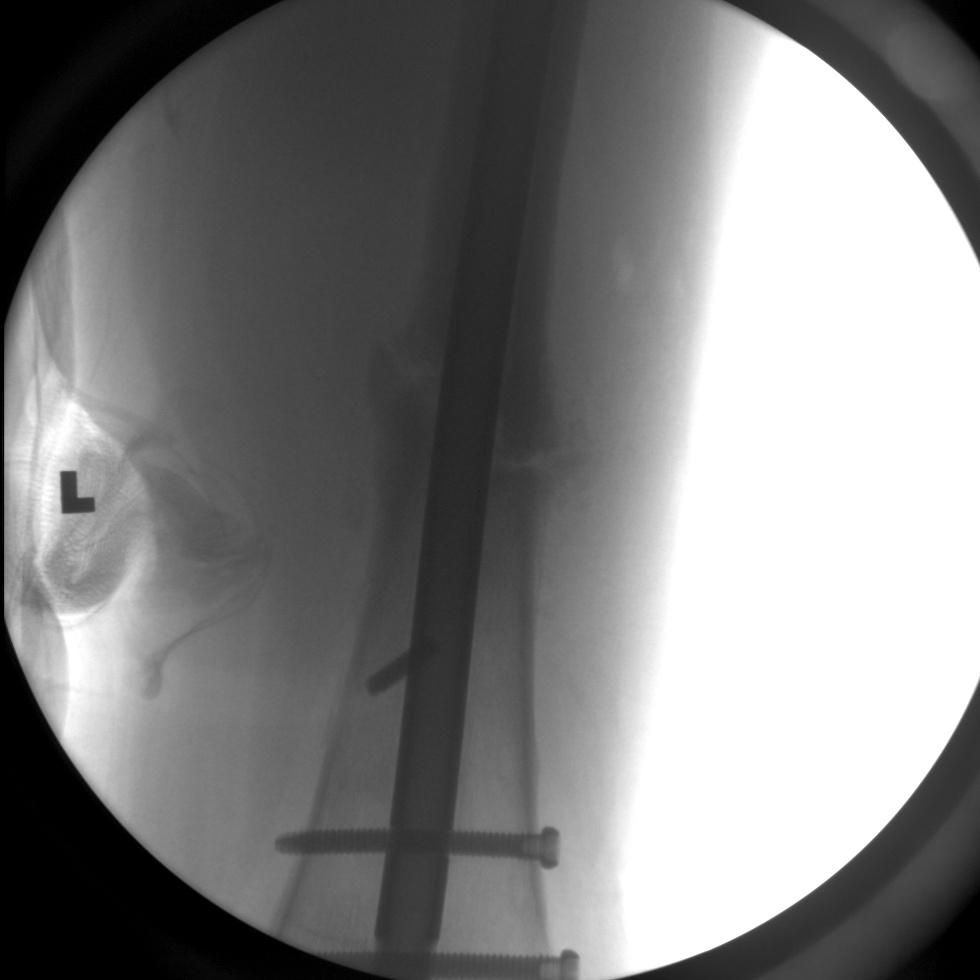
[im 6/6]
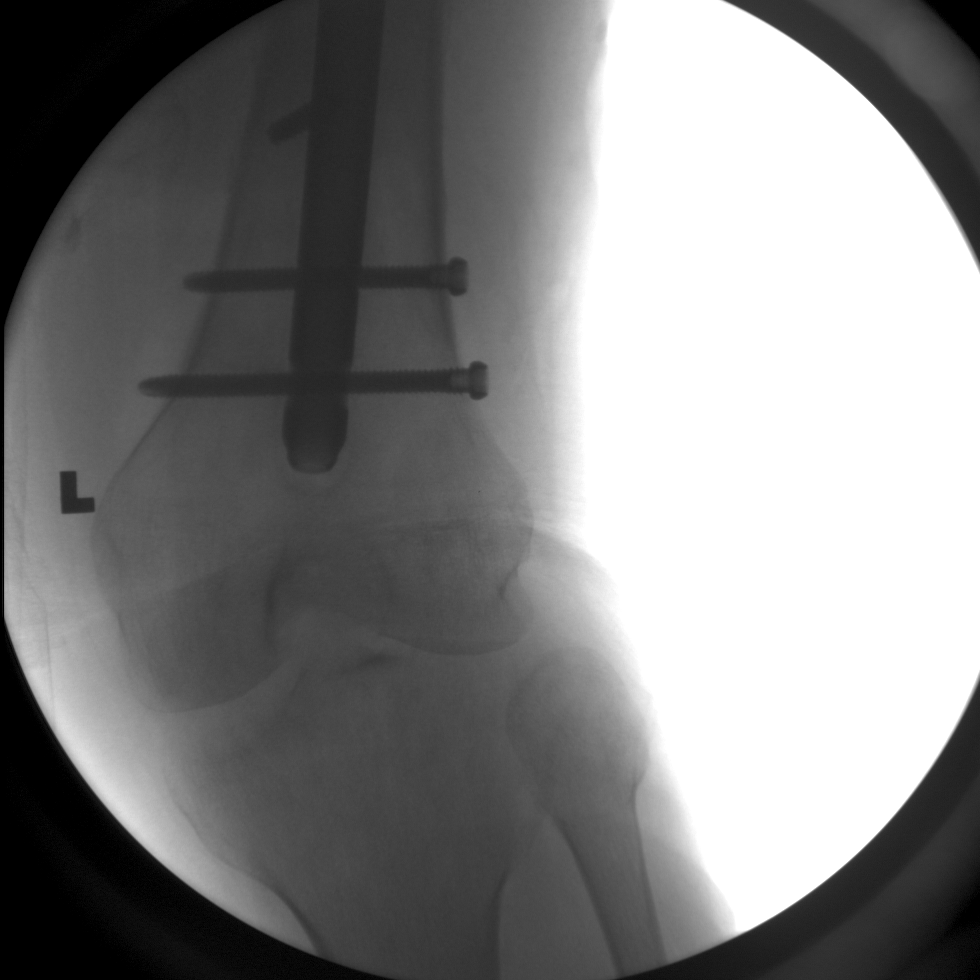

[6 of 6 positions shown; findings below may reference images not displayed]

FINDINGS: Multiple intraoperative spot images demonstrate
intermedullary nail across a midshaft left femoral fracture.  This
fracture appears chronic, but no old imaging available.  There
appears to be a fragment of a screw along the distal aspect of the
IM nail.  Two distal screws and one proximal screw now present.
IMPRESSION: Intramedullary nail placement across the chronic left midshaft
femoral fracture.

## 2018-11-02 ENCOUNTER — Other Ambulatory Visit: Payer: Self-pay

## 2018-11-02 ENCOUNTER — Emergency Department (HOSPITAL_COMMUNITY)
Admission: EM | Admit: 2018-11-02 | Discharge: 2018-11-02 | Disposition: A | Discharge status: Home / Self Care | Payer: Self-pay | Attending: Emergency Medicine | Admitting: Emergency Medicine

## 2018-11-02 ENCOUNTER — Encounter (HOSPITAL_COMMUNITY): Payer: Self-pay

## 2018-11-02 DIAGNOSIS — J020 Streptococcal pharyngitis: Secondary | ICD-10-CM | POA: Insufficient documentation

## 2018-11-02 LAB — GROUP A STREP BY PCR: Group A Strep by PCR: DETECTED — AB

## 2018-11-02 MED ORDER — PENICILLIN G BENZATHINE 1200000 UNIT/2ML IM SUSP
1.2000 10*6.[IU] | Freq: Once | INTRAMUSCULAR | Status: AC
  Administered 2018-11-02: 1.2 10*6.[IU] via INTRAMUSCULAR
  Filled 2018-11-02: qty 2

## 2018-11-02 NOTE — ED Notes (Signed)
Strep recollected and sent to lab  

## 2018-11-02 NOTE — ED Provider Notes (Signed)
[facility] EMERGENCY DEPARTMENT Provider Note   CSN: [id number] Arrival date & time: 11/02/18  7829     History   Chief Complaint Chief Complaint  Patient presents with  . Sore Throat    HPI Shawn Randall is a 32 y.o. male.  Patient presents to the emergency department for evaluation of sore throat.  Symptoms present for several days.  He reports that his wife recently had a similar illness.  Patient reports voice change and pain with swallowing.  He has not had any cough, congestion, nausea, vomiting or diarrhea.  He is unaware of any fever.     Past Medical History:  Diagnosis Date  . Environmental allergies     There are no active problems to display for this patient.   Past Surgical History:  Procedure Laterality Date  . FEMUR IM NAIL  06/18/2012   Procedure: INTRAMEDULLARY (IM) NAIL FEMORAL;  Surgeon: Cammy Copa, MD;  Location: [facility] OR;  Service: Orthopedics;  Laterality: Left;  Left femoral shaft nail exchange, hardware removal deep bone grafting  . FRACTURE SURGERY  11   lft femur        Home Medications    Prior to Admission medications   Medication Sig Start Date End Date Taking? Authorizing Provider  oxymetazoline (AFRIN) 0.05 % nasal spray Place 2 sprays into the nose as needed.    [provider]    Family History History reviewed. No pertinent family history.  Social History Social History   Tobacco Use  . Smoking status: Never Smoker  . Smokeless tobacco: Never Used  Substance Use Topics  . Alcohol use: No  . Drug use: No     Allergies   Patient has no known allergies.   Review of Systems Review of Systems  HENT: Positive for sore throat.   All other systems reviewed and are negative.    Physical Exam Updated Vital Signs BP (!) 134/98   Pulse 83   Temp 98.3 F (36.8 C) (Oral)   Resp 16   Wt 81.6 kg   SpO2 98%   BMI 29.95 kg/m   Physical Exam  Constitutional: He is oriented to  person, place, and time. He appears well-developed and well-nourished. No distress.  HENT:  Head: Normocephalic and atraumatic.  Right Ear: Hearing normal.  Left Ear: Hearing normal.  Nose: Nose normal.  Mouth/Throat: Mucous membranes are normal. Posterior oropharyngeal erythema present. No tonsillar abscesses.  Eyes: Pupils are equal, round, and reactive to light. Conjunctivae and EOM are normal.  Neck: Normal range of motion. Neck supple.  Cardiovascular: Regular rhythm, S1 normal and S2 normal. Exam reveals no gallop and no friction rub.  No murmur heard. Pulmonary/Chest: Effort normal and breath sounds normal. No respiratory distress. He exhibits no tenderness.  Abdominal: Soft. Normal appearance and bowel sounds are normal. There is no hepatosplenomegaly. There is no tenderness. There is no rebound, no guarding, no tenderness at McBurney's point and negative Murphy's sign. No hernia.  Musculoskeletal: Normal range of motion.  Neurological: He is alert and oriented to person, place, and time. He has normal strength. No cranial nerve deficit or sensory deficit. Coordination normal. GCS eye subscore is 4. GCS verbal subscore is 5. GCS motor subscore is 6.  Skin: Skin is warm, dry and intact. No rash noted. No cyanosis.  Psychiatric: He has a normal mood and affect. His speech is normal and behavior is normal. Thought content normal.  Nursing note and vitals reviewed.  ED Treatments / Results  Labs (all labs ordered are listed, but only abnormal results are displayed) Labs Reviewed  GROUP A STREP BY PCR - Abnormal; Notable for the following components:      Result Value   Group A Strep by PCR DETECTED (*)    All other components within normal limits    EKG None  Radiology No results found.  Procedures Procedures (including critical care time)  Medications Ordered in ED Medications  penicillin g benzathine (BICILLIN LA) 1200000 UNIT/2ML injection 1.2 Million Units (1.2  Million Units Intramuscular Given 11/02/18 0742)     Initial Impression / Assessment and Plan / ED Course  I have reviewed the triage vital signs and the nursing notes.  Pertinent labs & imaging results that were available during my care of the patient were reviewed by me and considered in my medical decision making (see chart for details).     Final Clinical Impressions(s) / ED Diagnoses   Final diagnoses:  Strep pharyngitis    ED Discharge Orders    None       Gilda Crease, MD 11/02/18 2314

## 2018-11-02 NOTE — ED Triage Notes (Signed)
Pt here for evaluation of a sore throat for 4 days. Wife had sore throat in the last week. Throat is red. Afebrile  Spanish Interpreter via Ipad has been used for the duration of triage with EDP present.

## 2018-11-02 NOTE — ED Notes (Signed)
Per lab no strep swab was ever received in the lab.  Pt to have swab recollected

## 2019-08-11 ENCOUNTER — Telehealth: Payer: Self-pay

## 2019-08-12 NOTE — Telephone Encounter (Signed)
Provided Covid lab results to Patient , voiced understanding.

## 2019-08-20 ENCOUNTER — Other Ambulatory Visit: Payer: Self-pay | Admitting: *Deleted

## 2019-08-20 DIAGNOSIS — J09X2 Influenza due to identified novel influenza A virus with other respiratory manifestations: Secondary | ICD-10-CM

## 2019-08-21 LAB — SPECIMEN STATUS REPORT

## 2019-08-21 LAB — NOVEL CORONAVIRUS, NAA: SARS-CoV-2, NAA: NOT DETECTED

## 2022-03-28 ENCOUNTER — Ambulatory Visit (INDEPENDENT_AMBULATORY_CARE_PROVIDER_SITE_OTHER): Payer: Self-pay

## 2022-03-28 ENCOUNTER — Encounter (HOSPITAL_COMMUNITY): Payer: Self-pay

## 2022-03-28 ENCOUNTER — Ambulatory Visit (HOSPITAL_COMMUNITY)
Admission: EM | Admit: 2022-03-28 | Discharge: 2022-03-28 | Disposition: A | Discharge status: Home / Self Care | Payer: Self-pay | Attending: Internal Medicine | Admitting: Internal Medicine

## 2022-03-28 DIAGNOSIS — R079 Chest pain, unspecified: Secondary | ICD-10-CM

## 2022-03-28 DIAGNOSIS — R931 Abnormal findings on diagnostic imaging of heart and coronary circulation: Secondary | ICD-10-CM

## 2022-03-28 DIAGNOSIS — R0789 Other chest pain: Secondary | ICD-10-CM

## 2022-03-28 NOTE — ED Triage Notes (Signed)
Pt presents with left side chest pressure and left hand weakness X 2 days. ?

## 2022-03-28 NOTE — ED Provider Notes (Signed)
?[facility] ? ? ? ?CSN: [id number] ?Arrival date & time: 03/28/22  1446 ? ? ?  ? ?History   ?Chief Complaint ?Chief Complaint  ?Patient presents with  ? Chest Pressure  ? Extremity Weakness  ? ? ?HPI ?Shawn Randall is a 36 y.o. male.  ? ?Patient presents today with a 3-day history of intermittent left-sided chest pain that radiates to his left arm.  Patient is Spanish-speaking and video interpreter was utilized during visit.  He is currently asymptomatic but during episodes pain is rated 5 on a 0-10 pain scale, described as aching, no aggravating or alleviating factors identified.  He denies any associated shortness of breath, nausea, vomiting but does report one episode of lightheadedness.  He denies any personal or family history of cardiovascular disease.  Denies any history of diabetes, smoking, hypertension, hyperlipidemia.  Patient is on a antihypertensive medication but review of previous vitals show persistently elevated blood pressure readings for the past several years.  He has not tried any medication for symptom management.  Denies any recent illness or URI symptoms including cough, congestion, fever, nausea, vomiting.  Denies any recent COVID-19 infection.  He denies any positional aspect of pain.  Denies worsening pain with exertion. ? ? ?Past Medical History:  ?Diagnosis Date  ? Environmental allergies   ? ? ?There are no problems to display for this patient. ? ? ?Past Surgical History:  ?Procedure Laterality Date  ? FEMUR IM NAIL  06/18/2012  ? Procedure: INTRAMEDULLARY (IM) NAIL FEMORAL;  Surgeon: Meredith Pel, MD;  Location: [facility];  Service: Orthopedics;  Laterality: Left;  Left femoral shaft nail exchange, hardware removal deep bone grafting  ? FRACTURE SURGERY  11  ? lft femur  ? ? ? ? ? ?Home Medications   ? ?Prior to Admission medications   ?Medication Sig Start Date End Date Taking? Authorizing Provider  ?oxymetazoline (AFRIN) 0.05 % nasal spray Place 2 sprays into the nose as  needed.    [provider]  ? ? ?Family History ?History reviewed. No pertinent family history. ? ?Social History ?Social History  ? ?Tobacco Use  ? Smoking status: Never  ? Smokeless tobacco: Never  ?Vaping Use  ? Vaping Use: Never used  ?Substance Use Topics  ? Alcohol use: No  ? Drug use: No  ? ? ? ?Allergies   ?Patient has no known allergies. ? ? ?Review of Systems ?Review of Systems  ?Constitutional:  Positive for activity change. Negative for appetite change, fatigue and fever.  ?Respiratory:  Negative for cough and shortness of breath.   ?Cardiovascular:  Positive for chest pain. Negative for palpitations.  ?Gastrointestinal:  Negative for abdominal pain, diarrhea, nausea and vomiting.  ?Musculoskeletal:  Positive for myalgias. Negative for arthralgias.  ?Neurological:  Negative for dizziness, weakness, light-headedness, numbness and headaches.  ? ? ?Physical Exam ?Triage Vital Signs ?ED Triage Vitals  ?Enc Vitals Group  ?   BP 03/28/22 1603 (!) 146/92  ?   Pulse Rate 03/28/22 1603 86  ?   Resp 03/28/22 1603 17  ?   Temp 03/28/22 1603 97.7 ?F (36.5 ?C)  ?   Temp Source 03/28/22 1603 Oral  ?   SpO2 03/28/22 1603 96 %  ?   Weight --   ?   Height --   ?   Head Circumference --   ?   Peak Flow --   ?   Pain Score 03/28/22 1602 0  ?   Pain Loc --   ?  Pain Edu? --   ?   Excl. in [place]? --   ? ?No data found. ? ?Updated Vital Signs ?BP (!) 146/92 (BP Location: Left Arm)   Pulse 86   Temp 97.7 ?F (36.5 ?C) (Oral)   Resp 17   SpO2 96%  ? ?Visual Acuity ?Right Eye Distance:   ?Left Eye Distance:   ?Bilateral Distance:   ? ?Right Eye Near:   ?Left Eye Near:    ?Bilateral Near:    ? ?Physical Exam ?Vitals reviewed.  ?Constitutional:   ?   General: He is awake.  ?   Appearance: Normal appearance. He is well-developed. He is not ill-appearing.  ?   Comments: Very pleasant male appears stated age in no acute distress sitting comfortably in exam room  ?HENT:  ?   Head: Normocephalic and atraumatic.   ?Cardiovascular:  ?   Rate and Rhythm: Normal rate and regular rhythm.  ?   Heart sounds: Normal heart sounds, S1 normal and S2 normal. No murmur heard. ?Pulmonary:  ?   Effort: Pulmonary effort is normal.  ?   Breath sounds: Normal breath sounds. No stridor. No wheezing, rhonchi or rales.  ?   Comments: Clear to auscultation bilaterally ?Chest:  ?   Chest wall: No deformity, swelling or tenderness.  ?   Comments: Pain not reproducible on exam ?Abdominal:  ?   General: Bowel sounds are normal.  ?   Palpations: Abdomen is soft.  ?   Tenderness: There is no abdominal tenderness. There is no right CVA tenderness, left CVA tenderness, guarding or rebound.  ?   Comments: Benign abdominal exam  ?Neurological:  ?   Mental Status: He is alert.  ?Psychiatric:     ?   Behavior: Behavior is cooperative.  ? ? ? ?UC Treatments / Results  ?Labs ?(all labs ordered are listed, but only abnormal results are displayed) ?Labs Reviewed - No data to display ? ?EKG ? ? ?Radiology ?DG Chest 2 View ? ?Result Date: 03/28/2022 ?CLINICAL DATA:  abnormal chest x-ray EXAM: CHEST - 2 VIEW COMPARISON:  Chest x-ray 03/28/2022 4:42 p.m. FINDINGS: The heart and mediastinal contours are within normal limits. Bilateral nipple markers noted with nipple shadows underlying. No focal consolidation. No pulmonary edema. No pleural effusion. No pneumothorax. No acute osseous abnormality. IMPRESSION: No active cardiopulmonary disease. Electronically Signed   By: Iven Finn M.D.   On: 03/28/2022 17:21  ? ?DG Chest 2 View ? ?Result Date: 03/28/2022 ?CLINICAL DATA:  Chest pain. EXAM: CHEST - 2 VIEW COMPARISON:  June 11, 2012. FINDINGS: The heart size and mediastinal contours are within normal limits. Left lung is clear. Nodular density seen in right lower lobe which may represent overlying nipple shadow. The visualized skeletal structures are unremarkable. IMPRESSION: Nodular density seen in right lower lobe which may represent overlying nipple shadow; repeat  radiograph with nipple markers is recommended to rule out pulmonary nodule. Electronically Signed   By: Marijo Conception M.D.   On: 03/28/2022 16:51   ? ?Procedures ?Procedures (including critical care time) ? ?Medications Ordered in UC ?Medications - No data to display ? ?Initial Impression / Assessment and Plan / UC Course  ?I have reviewed the triage vital signs and the nursing notes. ? ?Pertinent labs & imaging results that were available during my care of the patient were reviewed by me and considered in my medical decision making (see chart for details). ? ?  ? ?Patient is well-appearing and currently asymptomatic.  EKG was obtained that showed normal sinus rhythm with ventricular rate of 79 beats per minute without ischemic changes; compared to 06/20/2012 tracing nonspecific ST changes in lead III and early R wave progression.  Chest x-ray was obtained that initially had possible nodule, however, this was repeated with nipple markers which showed no acute cardiopulmonary condition.  Patient remained asymptomatic throughout visit.  Discussed that GERD could be contributing to his symptoms and recommended that he avoid spicy/acidic/fatty foods.  He is to rest and use Tylenol for pain relief.  Recommend he follow-up with cardiology and was given contact information for local provider with instruction to call to schedule an appointment.  Discussed that if he has any recurrent chest pain or any worsening symptoms including shortness of breath, lightheadedness, nausea/vomiting he needs to go directly to the emergency room to which he expressed understanding.  Work excuse note provided. ? ?Final Clinical Impressions(s) / UC Diagnoses  ? ?Final diagnoses:  ?Atypical chest pain  ? ? ? ?Discharge Instructions   ? ?  ?Your chest x-ray was normal.  The abnormality they initially saw was your nipple.  Please rest and drink plenty of fluid.  Please call and schedule an appointment with cardiology.  As we discussed if you  have any recurrent chest pain you need to go directly to the emergency room.  Eat a bland diet and avoid spicy/acidic food ? ? ? ? ?ED Prescriptions   ?None ?  ? ?PDMP not reviewed this encounter. ?  ?Onita Pfluger, Derry Skill, PA-C

## 2022-03-28 NOTE — Discharge Instructions (Addendum)
Your chest x-ray was normal.  The abnormality they initially saw was your nipple.  Please rest and drink plenty of fluid.  Please call and schedule an appointment with cardiology.  As we discussed if you have any recurrent chest pain you need to go directly to the emergency room.  Eat a bland diet and avoid spicy/acidic food ?
